# Patient Record
Sex: Female | Born: 1985 | Race: Black or African American | Hispanic: No | Marital: Single | Smoking: Never smoker
Health system: Southern US, Community
[De-identification: ages and names within clinical notes are randomized; demographics above are authoritative.]

## PROBLEM LIST (undated history)

## (undated) DIAGNOSIS — F909 Attention-deficit hyperactivity disorder, unspecified type: Secondary | ICD-10-CM

## (undated) HISTORY — PX: BREAST SURGERY: SHX581

---
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
## 2006-11-01 ENCOUNTER — Emergency Department (HOSPITAL_COMMUNITY): Admission: EM | Admit: 2006-11-01 | Discharge: 2006-11-01 | Payer: Self-pay | Admitting: Emergency Medicine

## 2007-07-14 ENCOUNTER — Inpatient Hospital Stay (HOSPITAL_COMMUNITY): Admission: AD | Admit: 2007-07-14 | Discharge: 2007-07-14 | Payer: Self-pay | Admitting: Family Medicine

## 2008-05-01 ENCOUNTER — Emergency Department (HOSPITAL_COMMUNITY): Admission: EM | Admit: 2008-05-01 | Discharge: 2008-05-02 | Payer: Self-pay | Admitting: Emergency Medicine

## 2008-05-14 ENCOUNTER — Encounter: Admission: RE | Admit: 2008-05-14 | Discharge: 2008-05-14 | Payer: Self-pay | Admitting: Internal Medicine

## 2008-09-26 ENCOUNTER — Encounter: Admission: RE | Admit: 2008-09-26 | Discharge: 2008-10-22 | Payer: Self-pay | Admitting: Occupational Medicine

## 2009-09-10 ENCOUNTER — Emergency Department (HOSPITAL_COMMUNITY): Admission: EM | Admit: 2009-09-10 | Discharge: 2009-09-10 | Payer: Self-pay | Admitting: Emergency Medicine

## 2009-09-15 ENCOUNTER — Emergency Department (HOSPITAL_COMMUNITY): Admission: EM | Admit: 2009-09-15 | Discharge: 2009-09-16 | Payer: Self-pay | Admitting: Emergency Medicine

## 2010-05-07 LAB — URINALYSIS, ROUTINE W REFLEX MICROSCOPIC
Bilirubin Urine: NEGATIVE
Glucose, UA: NEGATIVE mg/dL
Protein, ur: NEGATIVE mg/dL
Specific Gravity, Urine: 1.029 (ref 1.005–1.030)

## 2010-05-07 LAB — URINE MICROSCOPIC-ADD ON

## 2010-05-14 ENCOUNTER — Emergency Department (HOSPITAL_COMMUNITY)
Admission: EM | Admit: 2010-05-14 | Discharge: 2010-05-14 | Disposition: A | Discharge status: Home / Self Care | Payer: Self-pay | Attending: Emergency Medicine | Admitting: Emergency Medicine

## 2010-05-14 DIAGNOSIS — M545 Low back pain, unspecified: Secondary | ICD-10-CM | POA: Insufficient documentation

## 2010-05-14 DIAGNOSIS — T1490XA Injury, unspecified, initial encounter: Secondary | ICD-10-CM | POA: Insufficient documentation

## 2010-05-14 LAB — POCT PREGNANCY, URINE: Preg Test, Ur: NEGATIVE

## 2010-10-23 LAB — URINALYSIS, ROUTINE W REFLEX MICROSCOPIC
Glucose, UA: NEGATIVE
Protein, ur: NEGATIVE
pH: 5.5

## 2010-10-23 LAB — WET PREP, GENITAL

## 2010-10-23 LAB — URINE MICROSCOPIC-ADD ON

## 2010-10-23 LAB — POCT PREGNANCY, URINE: Preg Test, Ur: NEGATIVE

## 2010-11-06 LAB — URINE MICROSCOPIC-ADD ON

## 2010-11-06 LAB — URINALYSIS, ROUTINE W REFLEX MICROSCOPIC
Bilirubin Urine: NEGATIVE
Glucose, UA: NEGATIVE
Ketones, ur: NEGATIVE
Urobilinogen, UA: 1

## 2010-11-06 LAB — URINE CULTURE

## 2010-11-06 LAB — PREGNANCY, URINE: Preg Test, Ur: NEGATIVE

## 2011-04-17 ENCOUNTER — Encounter (HOSPITAL_COMMUNITY): Payer: Self-pay | Admitting: Emergency Medicine

## 2011-04-17 ENCOUNTER — Emergency Department (HOSPITAL_COMMUNITY)
Admission: EM | Admit: 2011-04-17 | Discharge: 2011-04-18 | Disposition: A | Discharge status: Home / Self Care | Payer: Self-pay | Attending: Emergency Medicine | Admitting: Emergency Medicine

## 2011-04-17 DIAGNOSIS — T07XXXA Unspecified multiple injuries, initial encounter: Secondary | ICD-10-CM

## 2011-04-17 DIAGNOSIS — IMO0002 Reserved for concepts with insufficient information to code with codable children: Secondary | ICD-10-CM | POA: Insufficient documentation

## 2011-04-17 DIAGNOSIS — M542 Cervicalgia: Secondary | ICD-10-CM | POA: Insufficient documentation

## 2011-04-17 DIAGNOSIS — M545 Low back pain, unspecified: Secondary | ICD-10-CM | POA: Insufficient documentation

## 2011-04-17 DIAGNOSIS — M25519 Pain in unspecified shoulder: Secondary | ICD-10-CM | POA: Insufficient documentation

## 2011-04-17 DIAGNOSIS — Y92410 Unspecified street and highway as the place of occurrence of the external cause: Secondary | ICD-10-CM | POA: Insufficient documentation
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
## 2011-04-18 ENCOUNTER — Emergency Department (HOSPITAL_COMMUNITY): Payer: Self-pay

## 2011-04-18 MED ORDER — CYCLOBENZAPRINE HCL 10 MG PO TABS: 10.0000 mg | ORAL_TABLET | Freq: Three times a day (TID) | ORAL | Status: AC | PRN

## 2011-04-18 MED ORDER — IBUPROFEN 600 MG PO TABS: 600.0000 mg | ORAL_TABLET | Freq: Four times a day (QID) | ORAL | Status: AC | PRN

## 2011-04-18 MED ORDER — KETOROLAC TROMETHAMINE 30 MG/ML IJ SOLN
30.0000 mg | Freq: Once | INTRAMUSCULAR | Status: AC
  Administered 2011-04-18: 30 mg via INTRAMUSCULAR
  Filled 2011-04-18: qty 1

## 2011-04-18 NOTE — Discharge Instructions (Signed)
The x-ray of your lower back tonight was normal of a muscle strain take the medication prescribed as directed.  He also can use moist heat to lower back pain or neck.  Several times a day
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
## 2011-04-18 NOTE — ED Provider Notes (Signed)
Medical screening examination/treatment/procedure(s) were performed by non-physician practitioner and as supervising physician I was immediately available for consultation/collaboration.   Hanley Seamen, MD 04/18/11 [phone]

## 2012-03-14 ENCOUNTER — Ambulatory Visit: Payer: Self-pay | Admitting: Family Medicine

## 2012-03-14 VITALS — BP 124/84 | HR 94 | Temp 100.8°F | Resp 16 | Ht 69.5 in | Wt 273.0 lb

## 2012-03-14 DIAGNOSIS — J020 Streptococcal pharyngitis: Secondary | ICD-10-CM

## 2012-03-14 DIAGNOSIS — R509 Fever, unspecified: Secondary | ICD-10-CM

## 2012-03-14 LAB — POCT URINALYSIS DIPSTICK
Glucose, UA: NEGATIVE
Nitrite, UA: NEGATIVE
Protein, UA: 30
Spec Grav, UA: >=1.03

## 2012-03-14 LAB — POCT RAPID STREP A (OFFICE): Rapid Strep A Screen: POSITIVE — AB

## 2012-03-14 MED ORDER — AMOXICILLIN 875 MG PO TABS: 875.0000 mg | ORAL_TABLET | Freq: Two times a day (BID) | ORAL | Status: DC

## 2012-03-14 MED ORDER — NON FORMULARY: 15.0000 mL | Freq: Once | Status: DC

## 2012-03-14 MED ORDER — IBUPROFEN 200 MG PO TABS: 800.0000 mg | ORAL_TABLET | Freq: Once | ORAL | Status: DC

## 2012-03-14 MED ORDER — HYDROCODONE-ACETAMINOPHEN 7.5-325 MG/15ML PO SOLN: 10.0000 mL | Freq: Four times a day (QID) | ORAL | Status: DC | PRN

## 2012-03-14 NOTE — Progress Notes (Signed)
Subjective:    Patient ID: Tanya Morse, female    DOB: 08-12-1985, 27 y.o.   MRN: [id number] Chief Complaint  Patient presents with  . Sore Throat    6 DAYS  . Headache  . Dizziness  . Generalized Body Aches  . Fever    HPI  Started 1 wk ago - congested, very sleepy.  To weak to walk at home - was crawling to get to the bathroom so mom finally brought her in.  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 Has been taking Tylenol cold and sinus to help with sxs but has not taking ANY medication - no tylenol or ibuprofen/aleve, etc today.  Today was the first day that she did not feel well enough to go to class.   She is sleeping a ton and feeling very swimmy headed. Not eating at all and not drinking much either as throat hurts so bad.  Hurts to move neck because her throat hurts - no muscle or spine pain.  HA is bad, she is not urinating much at all and it is very dark.  Hurts all over.  History reviewed. No pertinent past medical history. No current outpatient prescriptions on file prior to visit.   No current facility-administered medications on file prior to visit.   No Known Allergies  Review of Systems  Constitutional: Positive for fever, chills, diaphoresis, activity change, appetite change and fatigue. Negative for unexpected weight change.  HENT: Positive for congestion, sore throat, trouble swallowing, neck pain and voice change. Negative for ear pain, nosebleeds, rhinorrhea, sneezing, drooling, mouth sores, neck stiffness, postnasal drip, sinus pressure and ear discharge.   Eyes: Positive for photophobia. Negative for pain.  Respiratory: Positive for cough. Negative for shortness of breath.   Cardiovascular: Negative for chest pain, palpitations and leg swelling.  Gastrointestinal: Negative for nausea, vomiting, abdominal pain and constipation.  Endocrine: Positive for cold intolerance. Negative for polydipsia and polyuria.  Genitourinary: Positive for  decreased urine volume. Negative for dysuria and frequency.  Musculoskeletal: Positive for myalgias, back pain and arthralgias. Negative for joint swelling and gait problem.  Skin: Negative for rash.  Neurological: Positive for dizziness, weakness, light-headedness and headaches. Negative for syncope, facial asymmetry and numbness.  Hematological: Positive for adenopathy. Does not bruise/bleed easily.  Psychiatric/Behavioral: Negative for sleep disturbance.      BP 124/84  Pulse 94  Temp(Src) 100.8 F (38.2 C) (Oral)  Resp 16  Ht 5' 9.5" (1.765 m)  Wt 273 lb (123.832 kg)  BMI 39.75 kg/m2  SpO2 98% Objective:   Physical Exam  Constitutional: She is oriented to person, place, and time. She appears well-developed and well-nourished. She appears lethargic. She is sleeping. She has a sickly appearance. She appears ill. No distress.  HENT:  Head: Normocephalic and atraumatic. No trismus in the jaw.  Right Ear: Tympanic membrane, external ear and ear canal normal.  Left Ear: Tympanic membrane, external ear and ear canal normal.  Nose: Mucosal edema and rhinorrhea present.  Mouth/Throat: Uvula is midline. Mucous membranes are not pale and not dry. No edematous. Oropharyngeal exudate, posterior oropharyngeal edema and posterior oropharyngeal erythema present. No tonsillar abscesses.  Both tonsils swollen to 3+ with mild erythema and moderate amount of white purulent exudate on them.  Voice muffled due to pain with talking.  Eyes: Conjunctivae and EOM are normal. Pupils are equal, round, and reactive to light. Right eye exhibits no discharge. Left eye exhibits no discharge.  No scleral icterus.  + photophobia  Neck: Normal range of motion. Neck supple. No rigidity. Normal range of motion present. No Brudzinski's sign and no Kernig's sign noted. No mass and no thyromegaly present.  Neck pain is anterior, along lymphnodes. No tenderness posteriorly along c spine or paraspinal area.   Cardiovascular: Normal rate, regular rhythm, normal heart sounds and intact distal pulses.   Pulmonary/Chest: Effort normal and breath sounds normal.  Abdominal: Soft. Bowel sounds are normal. She exhibits no distension, no pulsatile liver, no pulsatile midline mass and no mass. There is no hepatosplenomegaly. There is generalized tenderness. There is CVA tenderness. There is no rigidity, no rebound, no guarding, no tenderness at McBurney's point and negative Murphy's sign. No hernia.  Abdominal exam difficult and limited due to obesity  Lymphadenopathy:       Head (right side): Submandibular, tonsillar, preauricular and posterior auricular adenopathy present. No submental adenopathy present.       Head (left side): Submandibular, tonsillar, preauricular and posterior auricular adenopathy present. No submental adenopathy present.    She has cervical adenopathy.       Right cervical: Superficial cervical adenopathy present. No deep cervical and no posterior cervical adenopathy present.      Left cervical: Superficial cervical adenopathy present. No deep cervical and no posterior cervical adenopathy present.       Right: No supraclavicular adenopathy present.       Left: No supraclavicular adenopathy present.  Neurological: She is oriented to person, place, and time. She appears lethargic.  Skin: Skin is warm and dry. She is not diaphoretic. No erythema.  Psychiatric: She has a normal mood and affect. Her behavior is normal.          Results for orders placed in visit on 03/14/12  POCT RAPID STREP A (OFFICE)      Result Value Range   Rapid Strep A Screen Positive (*) Negative  POCT URINALYSIS DIPSTICK      Result Value Range   Color, UA amber     Clarity, UA cloudy     Glucose, UA neg     Bilirubin, UA small     Ketones, UA 40     Spec Grav, UA >=1.030     Blood, UA neg     pH, UA 6.0     Protein, UA 30     Urobilinogen, UA 2.0     Nitrite, UA neg     Leukocytes, UA small (1+)       Assessment & Plan:  Fever - Plan: POCT rapid strep A, POCT urinalysis dipstick, ibuprofen (ADVIL,MOTRIN) tablet 800 mg, NON FORMULARY 15 mL, CANCELED: Comprehensive metabolic panel, CANCELED: POCT CBC, CANCELED: POCT UA - Microscopic Only, CANCELED: Epstein-Barr virus VCA, IgM.  Gave ibuprofen 800mg  po x 1 and lortab elixer 15 mL po x 1 in office.  Streptococcal sore throat - Plan: amoxicillin (AMOXIL) 875 MG tablet, ibuprofen (ADVIL,MOTRIN) tablet 800 mg, NON FORMULARY 15 mL  Meds ordered this encounter  Medications  . amoxicillin (AMOXIL) 875 MG tablet    Sig: Take 1 tablet (875 mg total) by mouth 2 (two) times daily.    Dispense:  20 tablet    Refill:  0  .  HYDROcodone-acetaminophen (HYCET) 7.5-325 mg/15 ml solution    Sig: Take 10-15 mLs by mouth every 6 (six) hours as needed for pain.    Dispense:  140 mL    Refill:  0  . ibuprofen (ADVIL,MOTRIN) tablet 800 mg  Sig:   . NON FORMULARY 15 mL    Sig:     Order Specific Question:  Brand Name:    Answer:  loratab elixer    Order Specific Question:  Generic name:    Answer:  hydrocodone    Order Specific Question:  Form:    Answer:  Elixir [19]    Order Specific Question:  Length of Therapy:    Answer:  Other, see comments     Comments:  once    Order Specific Question:  Reason for Non-Formulary    Answer:  medication name not in epic    Order Specific Question:  How soon needed? (normally 72 hrs needed to procure):    Answer:  0-24 hrs   Discussed extensively signs to watch out for - pt appears very ill, she is VERY dehydrated and very fatigued. However, she does not have health insurance and so pt and her mother would like to minimize testing and treatment as long as it is safe so she will push fluids and mother will keep a close eye on her.  If she worsens at all - more voice changes, still with trouble swallowing, continued lethargy, headache, etc - she needs to be seen back here for further evaluation as she could  develop a peritonsillar abscess or have something else complicating illness like mono.  Pt and mother understand and are agreeable to plan.  Pt should be feeling MUCH better within 24 hrs and if not, RTC.

## 2012-03-14 NOTE — Patient Instructions (Signed)

## 2012-03-16 ENCOUNTER — Telehealth: Payer: Self-pay

## 2012-03-16 NOTE — Telephone Encounter (Signed)
PT STATES SHE WAS RETURNING DR SHAW'S CALL. UNDERSTANDS SHE DOESN'T COME IN UNTIL THE WEEKEND PLEASE CALL [phone]

## 2012-03-16 NOTE — Telephone Encounter (Signed)
From Dr Clelia Croft note:Discussed extensively signs to watch out for - pt appears very ill, she is VERY dehydrated and very fatigued. However, she does not have health insurance and so pt and her mother would like to minimize testing and treatment as long as it is safe so she will push fluids and mother will keep a close eye on her. If she worsens at all - more voice changes, still with trouble swallowing, continued lethargy, headache, etc - she needs to be seen back here for further evaluation as she could develop a peritonsillar abscess or have something else complicating illness like mono. Pt and mother understand and are agreeable to plan. Pt should be feeling MUCH better within 24 hrs and if not, RTC.  I left message for her to call me back. So I can check on her progress.

## 2012-03-18 NOTE — Telephone Encounter (Signed)
Patient has not returned my calls. I left 2 messages, FYI to you

## 2013-05-31 ENCOUNTER — Emergency Department (HOSPITAL_COMMUNITY): Payer: No Typology Code available for payment source

## 2013-05-31 ENCOUNTER — Encounter (HOSPITAL_COMMUNITY): Payer: Self-pay | Admitting: Emergency Medicine

## 2013-05-31 ENCOUNTER — Emergency Department (HOSPITAL_COMMUNITY)
Admission: EM | Admit: 2013-05-31 | Discharge: 2013-05-31 | Disposition: A | Discharge status: Home / Self Care | Payer: No Typology Code available for payment source | Attending: Emergency Medicine | Admitting: Emergency Medicine

## 2013-05-31 DIAGNOSIS — Y92410 Unspecified street and highway as the place of occurrence of the external cause: Secondary | ICD-10-CM | POA: Insufficient documentation

## 2013-05-31 DIAGNOSIS — Y9389 Activity, other specified: Secondary | ICD-10-CM | POA: Insufficient documentation

## 2013-05-31 DIAGNOSIS — IMO0002 Reserved for concepts with insufficient information to code with codable children: Secondary | ICD-10-CM | POA: Insufficient documentation

## 2013-05-31 DIAGNOSIS — S0993XA Unspecified injury of face, initial encounter: Secondary | ICD-10-CM | POA: Insufficient documentation

## 2013-05-31 DIAGNOSIS — S199XXA Unspecified injury of neck, initial encounter: Secondary | ICD-10-CM

## 2013-05-31 DIAGNOSIS — M549 Dorsalgia, unspecified: Secondary | ICD-10-CM

## 2013-05-31 MED ORDER — IBUPROFEN 800 MG PO TABS
800.0000 mg | ORAL_TABLET | Freq: Once | ORAL | Status: AC
  Administered 2013-05-31: 800 mg via ORAL
  Filled 2013-05-31: qty 1

## 2013-05-31 MED ORDER — IBUPROFEN 600 MG PO TABS: 600.0000 mg | ORAL_TABLET | Freq: Three times a day (TID) | ORAL | Status: DC

## 2013-05-31 MED ORDER — OXYCODONE-ACETAMINOPHEN 5-325 MG PO TABS
1.0000 | ORAL_TABLET | Freq: Once | ORAL | Status: AC
  Administered 2013-05-31: 1 via ORAL
  Filled 2013-05-31: qty 1

## 2013-05-31 NOTE — ED Notes (Signed)
Assumed care of pt in hallway bed B, pt c/o pain 10/10 "all over"
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
## 2013-06-04 ENCOUNTER — Emergency Department (HOSPITAL_COMMUNITY): Payer: No Typology Code available for payment source

## 2013-06-04 ENCOUNTER — Encounter (HOSPITAL_COMMUNITY): Payer: Self-pay | Admitting: Emergency Medicine

## 2013-06-04 ENCOUNTER — Emergency Department (HOSPITAL_COMMUNITY)
Admission: EM | Admit: 2013-06-04 | Discharge: 2013-06-04 | Disposition: A | Discharge status: Home / Self Care | Payer: No Typology Code available for payment source | Attending: Emergency Medicine | Admitting: Emergency Medicine

## 2013-06-04 DIAGNOSIS — M25519 Pain in unspecified shoulder: Secondary | ICD-10-CM | POA: Insufficient documentation

## 2013-06-04 DIAGNOSIS — N926 Irregular menstruation, unspecified: Secondary | ICD-10-CM | POA: Insufficient documentation

## 2013-06-04 DIAGNOSIS — G8911 Acute pain due to trauma: Secondary | ICD-10-CM | POA: Insufficient documentation

## 2013-06-04 DIAGNOSIS — Z791 Long term (current) use of non-steroidal anti-inflammatories (NSAID): Secondary | ICD-10-CM | POA: Insufficient documentation

## 2013-06-04 DIAGNOSIS — R51 Headache: Secondary | ICD-10-CM | POA: Insufficient documentation

## 2013-06-04 DIAGNOSIS — I498 Other specified cardiac arrhythmias: Secondary | ICD-10-CM | POA: Insufficient documentation

## 2013-06-04 DIAGNOSIS — R209 Unspecified disturbances of skin sensation: Secondary | ICD-10-CM | POA: Insufficient documentation

## 2013-06-04 MED ORDER — HYDROCODONE-ACETAMINOPHEN 5-325 MG PO TABS
2.0000 | ORAL_TABLET | Freq: Once | ORAL | Status: AC
  Administered 2013-06-04: 2 via ORAL
  Filled 2013-06-04: qty 2

## 2013-06-04 MED ORDER — HYDROCODONE-ACETAMINOPHEN 5-325 MG PO TABS: 1.0000 | ORAL_TABLET | Freq: Four times a day (QID) | ORAL | Status: DC | PRN
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
## 2013-11-12 ENCOUNTER — Encounter (HOSPITAL_COMMUNITY): Payer: Self-pay | Admitting: Emergency Medicine

## 2013-11-12 ENCOUNTER — Emergency Department (HOSPITAL_COMMUNITY)
Admission: EM | Admit: 2013-11-12 | Discharge: 2013-11-12 | Disposition: A | Discharge status: Home / Self Care | Payer: No Typology Code available for payment source | Attending: Emergency Medicine | Admitting: Emergency Medicine

## 2013-11-12 DIAGNOSIS — G8911 Acute pain due to trauma: Secondary | ICD-10-CM | POA: Insufficient documentation

## 2013-11-12 DIAGNOSIS — M545 Low back pain, unspecified: Secondary | ICD-10-CM

## 2013-11-12 DIAGNOSIS — Z79899 Other long term (current) drug therapy: Secondary | ICD-10-CM | POA: Diagnosis not present

## 2013-11-12 MED ORDER — TRAMADOL HCL 50 MG PO TABS: 50.0000 mg | ORAL_TABLET | Freq: Four times a day (QID) | ORAL | Status: DC | PRN
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
## 2013-11-14 NOTE — ED Provider Notes (Signed)
Medical screening examination/treatment/procedure(s) were performed by non-physician practitioner and as supervising physician I was immediately available for consultation/collaboration.     Sadee Osland E Trust Crago, MD 11/14/13 1321 

## 2014-04-06 ENCOUNTER — Emergency Department (HOSPITAL_COMMUNITY): Payer: 59

## 2014-04-06 ENCOUNTER — Encounter (HOSPITAL_COMMUNITY): Payer: Self-pay | Admitting: Emergency Medicine

## 2014-04-06 ENCOUNTER — Emergency Department (HOSPITAL_COMMUNITY)
Admission: EM | Admit: 2014-04-06 | Discharge: 2014-04-06 | Disposition: A | Discharge status: Home / Self Care | Payer: 59 | Attending: Emergency Medicine | Admitting: Emergency Medicine

## 2014-04-06 DIAGNOSIS — S4991XA Unspecified injury of right shoulder and upper arm, initial encounter: Secondary | ICD-10-CM | POA: Insufficient documentation

## 2014-04-06 DIAGNOSIS — Y92410 Unspecified street and highway as the place of occurrence of the external cause: Secondary | ICD-10-CM | POA: Diagnosis not present

## 2014-04-06 DIAGNOSIS — Y9389 Activity, other specified: Secondary | ICD-10-CM | POA: Diagnosis not present

## 2014-04-06 DIAGNOSIS — Z8659 Personal history of other mental and behavioral disorders: Secondary | ICD-10-CM | POA: Diagnosis not present

## 2014-04-06 DIAGNOSIS — S8992XA Unspecified injury of left lower leg, initial encounter: Secondary | ICD-10-CM | POA: Insufficient documentation

## 2014-04-06 DIAGNOSIS — Z79899 Other long term (current) drug therapy: Secondary | ICD-10-CM | POA: Insufficient documentation

## 2014-04-06 DIAGNOSIS — M25521 Pain in right elbow: Secondary | ICD-10-CM

## 2014-04-06 DIAGNOSIS — Y998 Other external cause status: Secondary | ICD-10-CM | POA: Insufficient documentation

## 2014-04-06 DIAGNOSIS — M25562 Pain in left knee: Secondary | ICD-10-CM

## 2014-04-06 DIAGNOSIS — S59901A Unspecified injury of right elbow, initial encounter: Secondary | ICD-10-CM | POA: Diagnosis not present

## 2014-04-06 DIAGNOSIS — M25511 Pain in right shoulder: Secondary | ICD-10-CM

## 2014-04-06 HISTORY — DX: Attention-deficit hyperactivity disorder, unspecified type: F90.9

## 2014-04-06 MED ORDER — NAPROXEN 500 MG PO TABS: 500.0000 mg | ORAL_TABLET | Freq: Two times a day (BID) | ORAL | Status: DC

## 2014-04-06 MED ORDER — IBUPROFEN 800 MG PO TABS
800.0000 mg | ORAL_TABLET | Freq: Once | ORAL | Status: AC
  Administered 2014-04-06: 800 mg via ORAL
  Filled 2014-04-06: qty 1
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
## 2015-12-05 DIAGNOSIS — Z23 Encounter for immunization: Secondary | ICD-10-CM | POA: Diagnosis not present
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
## 2016-05-22 ENCOUNTER — Ambulatory Visit (HOSPITAL_COMMUNITY)
Admission: EM | Admit: 2016-05-22 | Discharge: 2016-05-22 | Disposition: A | Discharge status: Nursing Home (Includes ICF) | Payer: Self-pay

## 2016-05-22 ENCOUNTER — Encounter (HOSPITAL_COMMUNITY): Payer: Self-pay

## 2016-05-22 ENCOUNTER — Emergency Department (HOSPITAL_COMMUNITY)
Admission: EM | Admit: 2016-05-22 | Discharge: 2016-05-22 | Disposition: A | Discharge status: Home / Self Care | Payer: No Typology Code available for payment source | Attending: Emergency Medicine | Admitting: Emergency Medicine

## 2016-05-22 DIAGNOSIS — S161XXA Strain of muscle, fascia and tendon at neck level, initial encounter: Secondary | ICD-10-CM | POA: Diagnosis not present

## 2016-05-22 DIAGNOSIS — Z79899 Other long term (current) drug therapy: Secondary | ICD-10-CM | POA: Diagnosis not present

## 2016-05-22 DIAGNOSIS — Y92410 Unspecified street and highway as the place of occurrence of the external cause: Secondary | ICD-10-CM | POA: Insufficient documentation

## 2016-05-22 DIAGNOSIS — M546 Pain in thoracic spine: Secondary | ICD-10-CM | POA: Diagnosis not present

## 2016-05-22 DIAGNOSIS — F909 Attention-deficit hyperactivity disorder, unspecified type: Secondary | ICD-10-CM | POA: Insufficient documentation

## 2016-05-22 DIAGNOSIS — Y939 Activity, unspecified: Secondary | ICD-10-CM | POA: Insufficient documentation

## 2016-05-22 DIAGNOSIS — M542 Cervicalgia: Secondary | ICD-10-CM

## 2016-05-22 DIAGNOSIS — R51 Headache: Secondary | ICD-10-CM

## 2016-05-22 DIAGNOSIS — Y999 Unspecified external cause status: Secondary | ICD-10-CM | POA: Diagnosis not present

## 2016-05-22 DIAGNOSIS — T148XXA Other injury of unspecified body region, initial encounter: Secondary | ICD-10-CM

## 2016-05-22 DIAGNOSIS — M791 Myalgia: Secondary | ICD-10-CM

## 2016-05-22 DIAGNOSIS — S199XXA Unspecified injury of neck, initial encounter: Secondary | ICD-10-CM | POA: Diagnosis present

## 2016-05-22 LAB — POC URINE PREG, ED: Preg Test, Ur: NEGATIVE

## 2016-05-22 MED ORDER — FLUORESCEIN SODIUM 0.6 MG OP STRP
1.0000 | ORAL_STRIP | Freq: Once | OPHTHALMIC | Status: AC
  Administered 2016-05-22: 1 via OPHTHALMIC
  Filled 2016-05-22: qty 1

## 2016-05-22 MED ORDER — KETOROLAC TROMETHAMINE 30 MG/ML IJ SOLN
30.0000 mg | Freq: Once | INTRAMUSCULAR | Status: AC
  Administered 2016-05-22: 30 mg via INTRAMUSCULAR
  Filled 2016-05-22: qty 1

## 2016-05-22 MED ORDER — TETRACAINE HCL 0.5 % OP SOLN
1.0000 [drp] | Freq: Once | OPHTHALMIC | Status: AC
  Administered 2016-05-22: 1 [drp] via OPHTHALMIC
  Filled 2016-05-22: qty 4

## 2016-05-22 MED ORDER — IBUPROFEN 800 MG PO TABS: 800.0000 mg | ORAL_TABLET | Freq: Three times a day (TID) | ORAL | 0 refills | Status: DC

## 2016-05-22 MED ORDER — CYCLOBENZAPRINE HCL 10 MG PO TABS: 10.0000 mg | ORAL_TABLET | Freq: Two times a day (BID) | ORAL | 0 refills | Status: DC | PRN
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
## 2016-05-22 NOTE — Discharge Instructions (Addendum)
Take ibuprofen as directed for pain.  Take flexeril as directed for pain. Do not take it while driving as it will make you drowsy.   Follow-up with your primary care doctor in 24-48 hours.   Return to the emergency department for any worsenign headache, fever, numbness/weakness of arms or legs, difficulty walking or any other worsening or concerning symptoms.   If you do not have a primary care doctor you see regularly, please you the list below. Please call them to arrange for follow-up.    No Primary Care Doctor Call [facility]  [phone] Other agencies that provide inexpensive medical care    [facility]  [phone]    [facility]  [phone]    [facility]  [phone]    [facility]  [phone]    [facility]  [phone]    [facility]  [phone]

## 2016-05-22 NOTE — ED Notes (Signed)
PT DISCHARGED. INSTRUCTIONS AND PRESCRIPTIONS GIVEN. AAOX4. PT IN NO APPARENT DISTRESS. THE OPPORTUNITY TO ASK QUESTIONS WAS PROVIDED. 
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
## 2016-06-16 DIAGNOSIS — H5203 Hypermetropia, bilateral: Secondary | ICD-10-CM | POA: Diagnosis not present

## 2016-12-21 DIAGNOSIS — Z113 Encounter for screening for infections with a predominantly sexual mode of transmission: Secondary | ICD-10-CM | POA: Diagnosis not present

## 2016-12-21 DIAGNOSIS — F909 Attention-deficit hyperactivity disorder, unspecified type: Secondary | ICD-10-CM | POA: Diagnosis not present

## 2019-01-24 ENCOUNTER — Ambulatory Visit: Payer: Self-pay | Attending: Internal Medicine

## 2019-01-24 DIAGNOSIS — Z20822 Contact with and (suspected) exposure to covid-19: Secondary | ICD-10-CM

## 2019-01-24 DIAGNOSIS — Z20828 Contact with and (suspected) exposure to other viral communicable diseases: Secondary | ICD-10-CM | POA: Insufficient documentation

## 2019-01-25 ENCOUNTER — Ambulatory Visit: Payer: Self-pay | Attending: Internal Medicine

## 2019-01-25 DIAGNOSIS — Z20822 Contact with and (suspected) exposure to covid-19: Secondary | ICD-10-CM

## 2019-01-25 DIAGNOSIS — Z20828 Contact with and (suspected) exposure to other viral communicable diseases: Secondary | ICD-10-CM | POA: Insufficient documentation

## 2019-01-25 LAB — NOVEL CORONAVIRUS, NAA: SARS-CoV-2, NAA: NOT DETECTED

## 2019-01-26 ENCOUNTER — Telehealth: Payer: Self-pay | Admitting: Family

## 2019-01-26 ENCOUNTER — Ambulatory Visit: Payer: Self-pay | Attending: Internal Medicine

## 2019-01-26 LAB — NOVEL CORONAVIRUS, NAA: SARS-CoV-2, NAA: NOT DETECTED

## 2019-01-26 NOTE — Telephone Encounter (Signed)
Patient called back to receive COVID test results from 01/24/2019.Patient was advised that results from 01/25/19 where not back as of yet.

## 2019-02-02 ENCOUNTER — Ambulatory Visit: Payer: Self-pay | Attending: Internal Medicine

## 2019-02-02 DIAGNOSIS — Z20822 Contact with and (suspected) exposure to covid-19: Secondary | ICD-10-CM | POA: Insufficient documentation

## 2019-02-04 LAB — NOVEL CORONAVIRUS, NAA: SARS-CoV-2, NAA: NOT DETECTED

## 2019-05-25 ENCOUNTER — Encounter (HOSPITAL_COMMUNITY): Payer: Self-pay

## 2019-05-25 ENCOUNTER — Other Ambulatory Visit: Payer: Self-pay

## 2019-05-25 ENCOUNTER — Ambulatory Visit (HOSPITAL_COMMUNITY)
Admission: EM | Admit: 2019-05-25 | Discharge: 2019-05-25 | Disposition: A | Discharge status: Home / Self Care | Payer: Self-pay | Attending: Family Medicine | Admitting: Family Medicine

## 2019-05-25 DIAGNOSIS — M545 Low back pain, unspecified: Secondary | ICD-10-CM

## 2019-05-25 DIAGNOSIS — M25552 Pain in left hip: Secondary | ICD-10-CM

## 2019-05-25 MED ORDER — CYCLOBENZAPRINE HCL 10 MG PO TABS: ORAL_TABLET | ORAL | 0 refills | Status: DC

## 2019-05-25 MED ORDER — DICLOFENAC SODIUM 75 MG PO TBEC: 75.0000 mg | DELAYED_RELEASE_TABLET | Freq: Two times a day (BID) | ORAL | 0 refills | Status: DC

## 2019-05-25 NOTE — ED Provider Notes (Signed)
Tanya Morse   [id number] 05/25/19 Arrival Time: 2542  ASSESSMENT & PLAN:  1. Acute left-sided low back pain without sciatica   2. Left hip pain      Able to ambulate here and hemodynamically stable. No indication for imaging for plain imaging at this time.  Begin: Meds ordered this encounter  Medications  . diclofenac (VOLTAREN) 75 MG EC tablet    Sig: Take 1 tablet (75 mg total) by mouth 2 (two) times daily.    Dispense:  14 tablet    Refill:  0  . cyclobenzaprine (FLEXERIL) 10 MG tablet    Sig: Take 1 tablet by mouth 3 times daily as needed for muscle spasm. Warning: May cause drowsiness.    Dispense:  21 tablet    Refill:  0    Medication sedation precautions given. Encourage ROM/movement as tolerated.  Recommend: Follow-up Information    [place].   Why: If worsening or failing to improve as anticipated. Contact information: [address] [place] [phone]          Reviewed expectations re: course of current medical issues. Questions answered. Outlined signs and symptoms indicating need for more acute intervention. Patient verbalized understanding. After Visit Summary given.   SUBJECTIVE: History from: patient.  Tanya Morse is a 34 y.o. female who presents with complaint of fairly persistent left sided lower back and hip pain. Onset abrupt. First noted 5 d ago. Injury/trama: reports falling onto L side; pain noted afterward. History of back problems requiring medical care: no. Pain described as aching and without radiation. Aggravating factors: certain movements and prolonged walking/standing. Alleviating factors: rest. Progressive LE weakness or saddle anesthesia: none. Extremity sensation changes or weakness: none. Ambulatory without difficulty. Normal bowel/bladder habits: yes; without urinary retention. Normal PO intake without n/v. No associated abdominal pain/n/v. Self  treatment: has ibuprofen prn; mild help.    OBJECTIVE:  Vitals:   05/25/19 1522  BP: 106/80  Pulse: 78  Resp: 20  Temp: 98.5 F (36.9 C)  TempSrc: Oral  SpO2: 99%    General appearance: alert; no distress HEENT: [place]; AT Neck: supple with FROM; without midline tenderness CV: RRR Lungs: unlabored respirations; symmetrical air entry Abdomen: soft, non-tender; non-distended Back: moderate and poorly localized tenderness to palpation over left lumbar paraspinal musculature extending over buttock to left hip; FROM at waist; bruising: none; without midline tenderness Extremities: without edema; symmetrical without gross deformities; normal ROM of bilateral LE Skin: warm and dry Neurologic: normal gait; normal sensation and strength of LLE Psychological: alert and cooperative; normal mood and affect    No Known Allergies  Past Medical History:  Diagnosis Date  . ADHD (attention deficit hyperactivity disorder)    Social History   Socioeconomic History  . Marital status: Single    Spouse name: Not on file  . Number of children: Not on file  . Years of education: Not on file  . Highest education level: Not on file  Occupational History  . Not on file  Tobacco Use  . Smoking status: Never Smoker  . Smokeless tobacco: Never Used  Substance and Sexual Activity  . Alcohol use: Yes    Comment: occasional   . Drug use: No  . Sexual activity: Never  Other Topics Concern  . Not on file  Social History Narrative  . Not on file   Social Determinants of Health   Financial Resource Strain:   . Difficulty of Paying  Living Expenses:   Food Insecurity:   . Worried About Programme researcher, broadcasting/film/video in the Last Year:   . Barista in the Last Year:   Transportation Needs:   . Freight forwarder (Medical):   Marland Kitchen Lack of Transportation (Non-Medical):   Physical Activity:   . Days of Exercise per Week:   . Minutes of Exercise per Session:   Stress:   . Feeling of Stress :    Social Connections:   . Frequency of Communication with Friends and Family:   . Frequency of Social Gatherings with Friends and Family:   . Attends Religious Services:   . Active Member of Clubs or Organizations:   . Attends Banker Meetings:   Marland Kitchen Marital Status:   Intimate Partner Violence:   . Fear of Current or Ex-Partner:   . Emotionally Abused:   Marland Kitchen Physically Abused:   . Sexually Abused:    History reviewed. No pertinent family history. Past Surgical History:  Procedure Laterality Date  . BREAST SURGERY       Mardella Layman, MD 05/25/19 [phone]
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
## 2020-03-24 ENCOUNTER — Emergency Department (HOSPITAL_COMMUNITY): Payer: Self-pay

## 2020-03-24 ENCOUNTER — Emergency Department (HOSPITAL_COMMUNITY)
Admission: EM | Admit: 2020-03-24 | Discharge: 2020-03-24 | Disposition: A | Discharge status: Home / Self Care | Payer: Self-pay | Attending: Emergency Medicine | Admitting: Emergency Medicine

## 2020-03-24 ENCOUNTER — Encounter (HOSPITAL_COMMUNITY): Payer: Self-pay | Admitting: Obstetrics and Gynecology

## 2020-03-24 ENCOUNTER — Other Ambulatory Visit: Payer: Self-pay

## 2020-03-24 DIAGNOSIS — R1011 Right upper quadrant pain: Secondary | ICD-10-CM

## 2020-03-24 DIAGNOSIS — N3000 Acute cystitis without hematuria: Secondary | ICD-10-CM | POA: Insufficient documentation

## 2020-03-24 LAB — COMPREHENSIVE METABOLIC PANEL
ALT: 14 U/L (ref 0–44)
AST: 22 U/L (ref 15–41)
Albumin: 4.1 g/dL (ref 3.5–5.0)
Alkaline Phosphatase: 42 U/L (ref 38–126)
Anion gap: 10 (ref 5–15)
BUN: 8 mg/dL (ref 6–20)
CO2: 24 mmol/L (ref 22–32)
Calcium: 8.8 mg/dL — ABNORMAL LOW (ref 8.9–10.3)
Chloride: 106 mmol/L (ref 98–111)
Creatinine, Ser: 0.73 mg/dL (ref 0.44–1.00)
GFR, Estimated: >60 mL/min (ref >60)
Glucose, Bld: 93 mg/dL (ref 70–99)
Potassium: 3.1 mmol/L — ABNORMAL LOW (ref 3.5–5.1)
Sodium: 140 mmol/L (ref 135–145)
Total Bilirubin: 0.9 mg/dL (ref 0.3–1.2)
Total Protein: 7.7 g/dL (ref 6.5–8.1)

## 2020-03-24 LAB — CBC
HCT: 37.6 % (ref 36.0–46.0)
Hemoglobin: 12.4 g/dL (ref 12.0–15.0)
MCH: 31.2 pg (ref 26.0–34.0)
MCHC: 33 g/dL (ref 30.0–36.0)
MCV: 94.7 fL (ref 80.0–100.0)
Platelets: 212 10*3/uL (ref 150–400)
RBC: 3.97 MIL/uL (ref 3.87–5.11)
RDW: 13.3 % (ref 11.5–15.5)
WBC: 6.1 10*3/uL (ref 4.0–10.5)
nRBC: 0 % (ref 0.0–0.2)

## 2020-03-24 LAB — LIPASE, BLOOD: Lipase: 24 U/L (ref 11–51)

## 2020-03-24 LAB — URINALYSIS, ROUTINE W REFLEX MICROSCOPIC
Bilirubin Urine: NEGATIVE
Glucose, UA: NEGATIVE mg/dL
Hgb urine dipstick: NEGATIVE
Ketones, ur: 80 mg/dL — AB
Nitrite: POSITIVE — AB
Protein, ur: NEGATIVE mg/dL
Specific Gravity, Urine: 1.013 (ref 1.005–1.030)
pH: 6 (ref 5.0–8.0)

## 2020-03-24 LAB — I-STAT BETA HCG BLOOD, ED (MC, WL, AP ONLY): I-stat hCG, quantitative: <5 m[IU]/mL (ref <5)

## 2020-03-24 MED ORDER — MORPHINE SULFATE (PF) 4 MG/ML IV SOLN
4.0000 mg | Freq: Once | INTRAVENOUS | Status: AC
  Administered 2020-03-24: 4 mg via INTRAVENOUS
  Filled 2020-03-24: qty 1

## 2020-03-24 MED ORDER — POTASSIUM CHLORIDE ER 10 MEQ PO TBCR: 20.0000 meq | EXTENDED_RELEASE_TABLET | Freq: Every day | ORAL | 0 refills | Status: DC

## 2020-03-24 MED ORDER — CEPHALEXIN 500 MG PO CAPS: 500.0000 mg | ORAL_CAPSULE | Freq: Two times a day (BID) | ORAL | 0 refills | Status: AC

## 2020-03-24 NOTE — Discharge Instructions (Signed)
You are seen today for abdominal pain, your work-up today was assuring.  Your urine does show urinary tract infection and your potassium was slightly low.  I did prescribe antibiotics for your urinary tract infection and I also prescribed you some potassium you can take at home.  Please follow-up with your primary care for the next couple of days.  Please speak to your pharmacist about any new medications prescribed today in regards side effects or other interactions with them.  Get help right away if: Your pain does not go away as soon as your health care provider told you to expect. You cannot stop vomiting. Your pain is only in areas of the abdomen, such as the right side or the left lower portion of the abdomen. Pain on the right side could be caused by appendicitis. You have bloody or black stools, or stools that look like tar. You have severe pain, cramping, or bloating in your abdomen. You have signs of dehydration, such as: Dark urine, very little urine, or no urine. Cracked lips. Dry mouth. Sunken eyes. Sleepiness. Weakness. You have trouble breathing or chest pain.

## 2020-03-24 NOTE — ED Provider Notes (Signed)
[facility]-EMERGENCY DEPT Provider Note   CSN: [id number] Arrival date & time: 03/24/20  1030     History No chief complaint on file.   Tanya Morse is a 35 y.o. female with prior medical history of ADHD that presents the emerge department today for abdominal pain.  Patient states that around 9 in the morning she started having right upper quadrant abdominal pain.  Is constant, not intermittent.  Does not radiate anywhere.  No shoulder pain or back pain.  States that the pain is a stabbing sensation, right now is a 7 out of 10.  Denies any vaginal complaints or urinary complaints.  States that she never had pain like this before.  Denies any abdominal surgeries.  No abdominal disease that he is aware of.  No vomiting or diarrhea.  No fevers.  No cough or URI symptoms.  No other complaints.  No biliary disease that she is aware of.  Pain did not start after eating, states that she did not eat this morning because she did not have any appetite.  Was feeling fine yesterday.  No other complaints.  Works as a Lawyer, often lifts heavy patients.  HPI     Past Medical History:  Diagnosis Date  . ADHD (attention deficit hyperactivity disorder)     There are no problems to display for this patient.   Past Surgical History:  Procedure Laterality Date  . BREAST SURGERY       OB History   No obstetric history on file.     No family history on file.  Social History   Tobacco Use  . Smoking status: Never Smoker  . Smokeless tobacco: Never Used  Vaping Use  . Vaping Use: Never used  Substance Use Topics  . Alcohol use: Yes    Comment: occasional   . Drug use: No    Home Medications Prior to Admission medications   Medication Sig Start Date End Date Taking? Authorizing Provider  cephALEXin (KEFLEX) 500 MG capsule Take 1 capsule (500 mg total) by mouth 2 (two) times daily for 5 days. 03/24/20 03/29/20 Yes , , PA-C  potassium chloride (KLOR-CON) 10 MEQ  tablet Take 2 tablets (20 mEq total) by mouth daily for 10 days. 03/24/20 04/03/20 Yes , , PA-C  cyclobenzaprine (FLEXERIL) 10 MG tablet Take 1 tablet by mouth 3 times daily as needed for muscle spasm. Warning: May cause drowsiness. Patient not taking: No sig reported 05/25/19   Mardella Layman, MD  diclofenac (VOLTAREN) 75 MG EC tablet Take 1 tablet (75 mg total) by mouth 2 (two) times daily. Patient not taking: No sig reported 05/25/19   Mardella Layman, MD    Allergies    Patient has no known allergies.  Review of Systems   Review of Systems  Constitutional: Negative for chills, diaphoresis, fatigue and fever.  HENT: Negative for congestion, sore throat and trouble swallowing.   Eyes: Negative for pain and visual disturbance.  Respiratory: Negative for cough, shortness of breath and wheezing.   Cardiovascular: Negative for chest pain, palpitations and leg swelling.  Gastrointestinal: Positive for abdominal pain. Negative for abdominal distention, diarrhea, nausea and vomiting.  Genitourinary: Negative for difficulty urinating.  Musculoskeletal: Negative for back pain, neck pain and neck stiffness.  Skin: Negative for pallor.  Neurological: Negative for dizziness, speech difficulty, weakness and headaches.  Psychiatric/Behavioral: Negative for confusion.    Physical Exam Updated Vital Signs BP 130/83   Pulse (!) 48   Temp 98.7 F (37.1  C) (Oral)   Resp 16   Ht 5\' 9"  (1.753 m)   Wt 103 kg   LMP 03/10/2020 (Approximate)   SpO2 100%   BMI 33.53 kg/m   Physical Exam Constitutional:      General: She is not in acute distress.    Appearance: Normal appearance. She is not ill-appearing, toxic-appearing or diaphoretic.  HENT:     Mouth/Throat:     Mouth: Mucous membranes are moist.     Pharynx: Oropharynx is clear.  Eyes:     General: No scleral icterus.    Extraocular Movements: Extraocular movements intact.     Pupils: Pupils are equal, round, and reactive to light.   Cardiovascular:     Rate and Rhythm: Normal rate and regular rhythm.     Pulses: Normal pulses.     Heart sounds: Normal heart sounds.  Pulmonary:     Effort: Pulmonary effort is normal. No respiratory distress.     Breath sounds: Normal breath sounds. No stridor. No wheezing, rhonchi or rales.  Chest:     Chest wall: No tenderness.  Abdominal:     General: Abdomen is flat. There is no distension.     Palpations: Abdomen is soft.     Tenderness: There is abdominal tenderness in the right upper quadrant. There is no guarding or rebound. Negative signs include McBurney's sign and obturator sign.       Comments: Right upper quadrant tenderness, no guarding.  No true Murphy sign.  Musculoskeletal:        General: No swelling or tenderness. Normal range of motion.     Cervical back: Normal range of motion and neck supple. No rigidity.     Right lower leg: No edema.     Left lower leg: No edema.  Skin:    General: Skin is warm and dry.     Capillary Refill: Capillary refill takes less than 2 seconds.     Coloration: Skin is not pale.  Neurological:     General: No focal deficit present.     Mental Status: She is alert and oriented to person, place, and time.  Psychiatric:        Mood and Affect: Mood normal.        Behavior: Behavior normal.     ED Results / Procedures / Treatments   Labs (all labs ordered are listed, but only abnormal results are displayed) Labs Reviewed  COMPREHENSIVE METABOLIC PANEL - Abnormal; Notable for the following components:      Result Value   Potassium 3.1 (*)    Calcium 8.8 (*)    All other components within normal limits  URINALYSIS, ROUTINE W REFLEX MICROSCOPIC - Abnormal; Notable for the following components:   APPearance HAZY (*)    Ketones, ur 80 (*)    Nitrite POSITIVE (*)    Leukocytes,Ua TRACE (*)    Bacteria, UA MANY (*)    All other components within normal limits  URINE CULTURE  LIPASE, BLOOD  CBC  I-STAT BETA HCG BLOOD, ED  ([facility], [facility], [facility] ONLY)    EKG None  Radiology DG Chest Port 1 View  Result Date: 03/24/2020 CLINICAL DATA:  Right rib and right upper quadrant pain. EXAM: PORTABLE CHEST 1 VIEW COMPARISON:  None. FINDINGS: The heart size and mediastinal contours are within normal limits. Both lungs are clear. No evidence of pneumothorax or pleural effusion. No definite abnormality of ribs seen on this portable single-view exam. IMPRESSION: Negative. No active cardiopulmonary disease.  Electronically Signed   By: Danae Orleans M.D.   On: 03/24/2020 12:49   US Abdomen Limited RUQ (LIVER/GB)  Result Date: 03/24/2020 CLINICAL DATA:  Right upper quadrant abdominal pain beginning this morning. EXAM: ULTRASOUND ABDOMEN LIMITED RIGHT UPPER QUADRANT COMPARISON:  None. FINDINGS: Gallbladder: No gallstones or wall thickening visualized. No sonographic Murphy sign noted by sonographer. Common bile duct: Diameter: 3 mm, within normal limits. Liver: No focal lesion identified. Within normal limits in parenchymal echogenicity. Portal vein is patent on color Doppler imaging with normal direction of blood flow towards the liver. Other: None. IMPRESSION: Negative. No hepatobiliary abnormality identified. Electronically Signed   By: Danae Orleans M.D.   On: 03/24/2020 12:54    Procedures Procedures   Medications Ordered in ED Medications  morphine 4 MG/ML injection 4 mg (4 mg Intravenous Given 03/24/20 1234)    ED Course  I have reviewed the triage vital signs and the nursing notes.  Pertinent labs & imaging results that were available during my care of the patient were reviewed by me and considered in my medical decision making (see chart for details).    MDM Rules/Calculators/A&P                         Tanya Morse is a 35 y.o. female with prior medical history of ADHD that presents the emerge department today for abdominal pain in the right upper quadrant for the past couple of hours.  Work-up today with unremarkable CBC,  no leukocytosis.  Negative pregnancy test.  CMP with potassium 3.1, did give her prescription for this.  UA does show signs of UTI, shared decision making with patient if she wants to treat this since she is not having any dysuria however she is having abdominal pain.  Patient states that she does want to treat this.  Antibiotics provided.  Right upper quadrant ultrasound negative for Chole.  Chest x-ray negative, interpreted by me.    Upon reevaluation, patient's pain is gone, no surgical abdomen, patient to be discharged at this time.  She will follow up with her PCP.  Doubt need for further emergent work up at this time. I explained the diagnosis and have given explicit precautions to return to the ER including for any other new or worsening symptoms. The patient understands and accepts the medical plan as it's been dictated and I have answered their questions. Discharge instructions concerning home care and prescriptions have been given. The patient is STABLE and is discharged to home in good condition.   Final Clinical Impression(s) / ED Diagnoses Final diagnoses:  RUQ pain  Acute cystitis without hematuria    Rx / DC Orders ED Discharge Orders         Ordered    potassium chloride (KLOR-CON) 10 MEQ tablet  Daily        03/24/20 1446    cephALEXin (KEFLEX) 500 MG capsule  2 times daily        03/24/20 1446           Farrel Gordon, PA-C 03/24/20 1510    Cathren Laine, MD 03/28/20 [phone]

## 2020-03-24 NOTE — ED Triage Notes (Signed)
Patient reports to the ER for abdominal pain. VSS per EMS. Patient denies pain with urination. LMP x2 weeks ago. Patient reports RUQ pain.

## 2020-03-26 LAB — URINE CULTURE: Culture: 80000 — AB

## 2020-03-27 ENCOUNTER — Telehealth: Payer: Self-pay | Admitting: Emergency Medicine

## 2020-03-27 NOTE — Telephone Encounter (Signed)
Post ED Visit - Positive Culture Follow-up  Culture report reviewed by antimicrobial stewardship pharmacist: [facility] []  Nathan Batchelder, Pharm.D. []  , Pharm.D., BCPS AQ-ID []  , Pharm.D., BCPS []  Celedonio Miyamoto, .D., BCPS []  [place], .D., BCPS, AAHIVP []  Georgina Pillion, Pharm.D., BCPS, AAHIVP []  [address], PharmD, BCPS []  , PharmD, BCPS []  [place], PharmD, BCPS []  [place], PharmD []  , PharmD, BCPS []  Estella Husk, PharmD  Pharmacy Team []  Lysle Pearl, PharmD []  , PharmD []  Phillips Climes, PharmD []  , Rph []  Agapito Games) , PharmD []  Verlan Friends, PharmD []  , PharmD []  Mervyn Gay, PharmD []  , PharmD []  Vinnie Level, PharmD []  [facility], PharmD []  , PharmD []  Len Childs, PharmD   Positive urine culture Treated with cephalexin, organism sensitive to the same and no further patient follow-up is required at this time.  03/27/2020, 9:52 AM

## 2020-08-19 ENCOUNTER — Other Ambulatory Visit: Payer: Self-pay

## 2020-08-19 ENCOUNTER — Ambulatory Visit
Admission: EM | Admit: 2020-08-19 | Discharge: 2020-08-19 | Disposition: A | Discharge status: Home / Self Care | Payer: Self-pay | Attending: Internal Medicine | Admitting: Internal Medicine

## 2020-08-19 ENCOUNTER — Encounter: Payer: Self-pay | Admitting: Emergency Medicine

## 2020-08-19 DIAGNOSIS — U071 COVID-19: Secondary | ICD-10-CM

## 2020-08-19 NOTE — ED Provider Notes (Signed)
[facility]    CSN: [id number] Arrival date & time: 08/19/20  1037      History   Chief Complaint Chief Complaint  Patient presents with   Headache   Shortness of Breath   Chills   Fatigue    HPI Tanya Morse is a 35 y.o. female to the urgent care for evaluation regarding recent COVID infection.  Patient had some headaches, chills, shortness of breath, fatigue and loss of appetite.  She tested positive for COVID-19 virus 4 days ago.  Patient states her symptoms are better and she is looking forward to going back to work tomorrow.  She inquired about antiviral agents.  She has no chronic medical problems.Marland Kitchen   HPI  Past Medical History:  Diagnosis Date   ADHD (attention deficit hyperactivity disorder)     There are no problems to display for this patient.   Past Surgical History:  Procedure Laterality Date   BREAST SURGERY      OB History   No obstetric history on file.      Home Medications    Prior to Admission medications   Not on File    Family History History reviewed. No pertinent family history.  Social History Social History   Tobacco Use   Smoking status: Never   Smokeless tobacco: Never  Vaping Use   Vaping Use: Never used  Substance Use Topics   Alcohol use: Yes    Comment: occasional    Drug use: No     Allergies   Patient has no known allergies.   Review of Systems Review of Systems  Respiratory: Negative.    Cardiovascular: Negative.   Musculoskeletal: Negative.   Neurological: Negative.     Physical Exam Triage Vital Signs ED Triage Vitals  Enc Vitals Group     BP 08/19/20 1134 112/80     Pulse Rate 08/19/20 1134 68     Resp 08/19/20 1134 18     Temp 08/19/20 1134 98 F (36.7 C)     Temp Source 08/19/20 1134 Oral     SpO2 08/19/20 1134 98 %     Weight --      Height --      Head Circumference --      Peak Flow --      Pain Score 08/19/20 1132 0     Pain Loc --      Pain Edu? --      Excl. in GC?  --    No data found.  Updated Vital Signs BP 112/80   Pulse 68   Temp 98 F (36.7 C) (Oral)   Resp 18   SpO2 98%   Visual Acuity Right Eye Distance:   Left Eye Distance:   Bilateral Distance:    Right Eye Near:   Left Eye Near:    Bilateral Near:     Physical Exam Vitals and nursing note reviewed.  Constitutional:      General: She is not in acute distress.    Appearance: She is not ill-appearing.  Cardiovascular:     Rate and Rhythm: Normal rate and regular rhythm.     Heart sounds: Normal heart sounds.  Pulmonary:     Effort: Pulmonary effort is normal.     Breath sounds: Normal breath sounds.  Abdominal:     General: Bowel sounds are normal.     Palpations: Abdomen is soft.  Neurological:     Mental Status: She is alert.  UC Treatments / Results  Labs (all labs ordered are listed, but only abnormal results are displayed) Labs Reviewed - No data to display  EKG   Radiology No results found.  Procedures Procedures (including critical care time)  Medications Ordered in UC Medications - No data to display  Initial Impression / Assessment and Plan / UC Course  I have reviewed the triage vital signs and the nursing notes.  Pertinent labs & imaging results that were available during my care of the patient were reviewed by me and considered in my medical decision making (see chart for details).     1.  COVID-19 infection: Patient is improving clinically. We discussed the indications for antiviral medications. Patient is not a candidate for antiviral medications given that her symptoms is day 4 of 5 today and she has no chronic medical problems or immunosuppression. Patient is advised to wear N95 when she goes back to work tomorrow.  She verbalized understanding. Final Clinical Impressions(s) / UC Diagnoses   Final diagnoses:  COVID-19 virus infection     Discharge Instructions      Continue taking Tylenol or Motrin as needed for body aches  and or fever Increase oral fluid intake Follow the precautions at your workplace and wear your mask for the next 5 days was at work or around people. There is no indication for antiviral medications at this time.   ED Prescriptions   None    PDMP not reviewed this encounter.   Merrilee Jansky, MD 08/19/20 [phone]

## 2020-08-19 NOTE — Discharge Instructions (Addendum)
Continue taking Tylenol or Motrin as needed for body aches and or fever Increase oral fluid intake Follow the precautions at your workplace and wear your mask for the next 5 days was at work or around people. There is no indication for antiviral medications at this time.

## 2020-08-19 NOTE — ED Triage Notes (Signed)
Pt is present today with HA, SOB, chills, fatigue, loss of appetite. Pt states that she tested positive for covid Friday

## 2021-04-16 ENCOUNTER — Other Ambulatory Visit: Payer: Self-pay

## 2021-04-16 ENCOUNTER — Emergency Department (HOSPITAL_COMMUNITY): Payer: BC Managed Care – PPO

## 2021-04-16 ENCOUNTER — Emergency Department (HOSPITAL_COMMUNITY)
Admission: EM | Admit: 2021-04-16 | Discharge: 2021-04-16 | Disposition: A | Discharge status: Home / Self Care | Payer: BC Managed Care – PPO | Attending: Emergency Medicine | Admitting: Emergency Medicine

## 2021-04-16 ENCOUNTER — Encounter (HOSPITAL_COMMUNITY): Payer: Self-pay | Admitting: Emergency Medicine

## 2021-04-16 DIAGNOSIS — R0602 Shortness of breath: Secondary | ICD-10-CM | POA: Insufficient documentation

## 2021-04-16 DIAGNOSIS — R051 Acute cough: Secondary | ICD-10-CM | POA: Insufficient documentation

## 2021-04-16 DIAGNOSIS — R059 Cough, unspecified: Secondary | ICD-10-CM | POA: Diagnosis present

## 2021-04-16 MED ORDER — ALBUTEROL SULFATE HFA 108 (90 BASE) MCG/ACT IN AERS
2.0000 | INHALATION_SPRAY | Freq: Once | RESPIRATORY_TRACT | Status: AC
Start: 2021-04-16 — End: 2021-04-16
  Administered 2021-04-16: 2 via RESPIRATORY_TRACT
  Filled 2021-04-16: qty 6.7

## 2021-04-16 MED ORDER — ALBUTEROL SULFATE (2.5 MG/3ML) 0.083% IN NEBU
5.0000 mg | INHALATION_SOLUTION | Freq: Once | RESPIRATORY_TRACT | Status: AC
  Administered 2021-04-16: 5 mg via RESPIRATORY_TRACT
  Filled 2021-04-16: qty 6
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
## 2021-04-16 NOTE — ED Notes (Signed)
Inhaler education preformed.  Pt states understanding and able to demonstrate teaching correctly.  ?

## 2021-04-16 NOTE — ED Triage Notes (Signed)
Pt reports cleaning and mixing bleach with toilet bowl cleaner and inhaling it. Pt reports now feeling lightheaded and weak.   ?

## 2021-04-16 NOTE — ED Notes (Signed)
Discharge instructions were discussed with pt. Inhaler was provided and discussed by previous Charity fundraiser. Pt verbalized understanding with no questions at this time. Pt to go home with family/friend at bedside.  ?

## 2021-04-16 NOTE — Discharge Instructions (Signed)
Use the inhaler 2 puffs every 3-4 hours as needed for cough or trouble breathing.  Follow-up with your doctor as needed for problems ?

## 2021-11-30 IMAGING — DX DG CHEST 1V PORT
1 series · 1 of 1 positions shown · non-contrast
Comparison: None.

CLINICAL DATA: Right rib and right upper quadrant pain.

EXAM:
PORTABLE CHEST 1 VIEW

[chest ap]
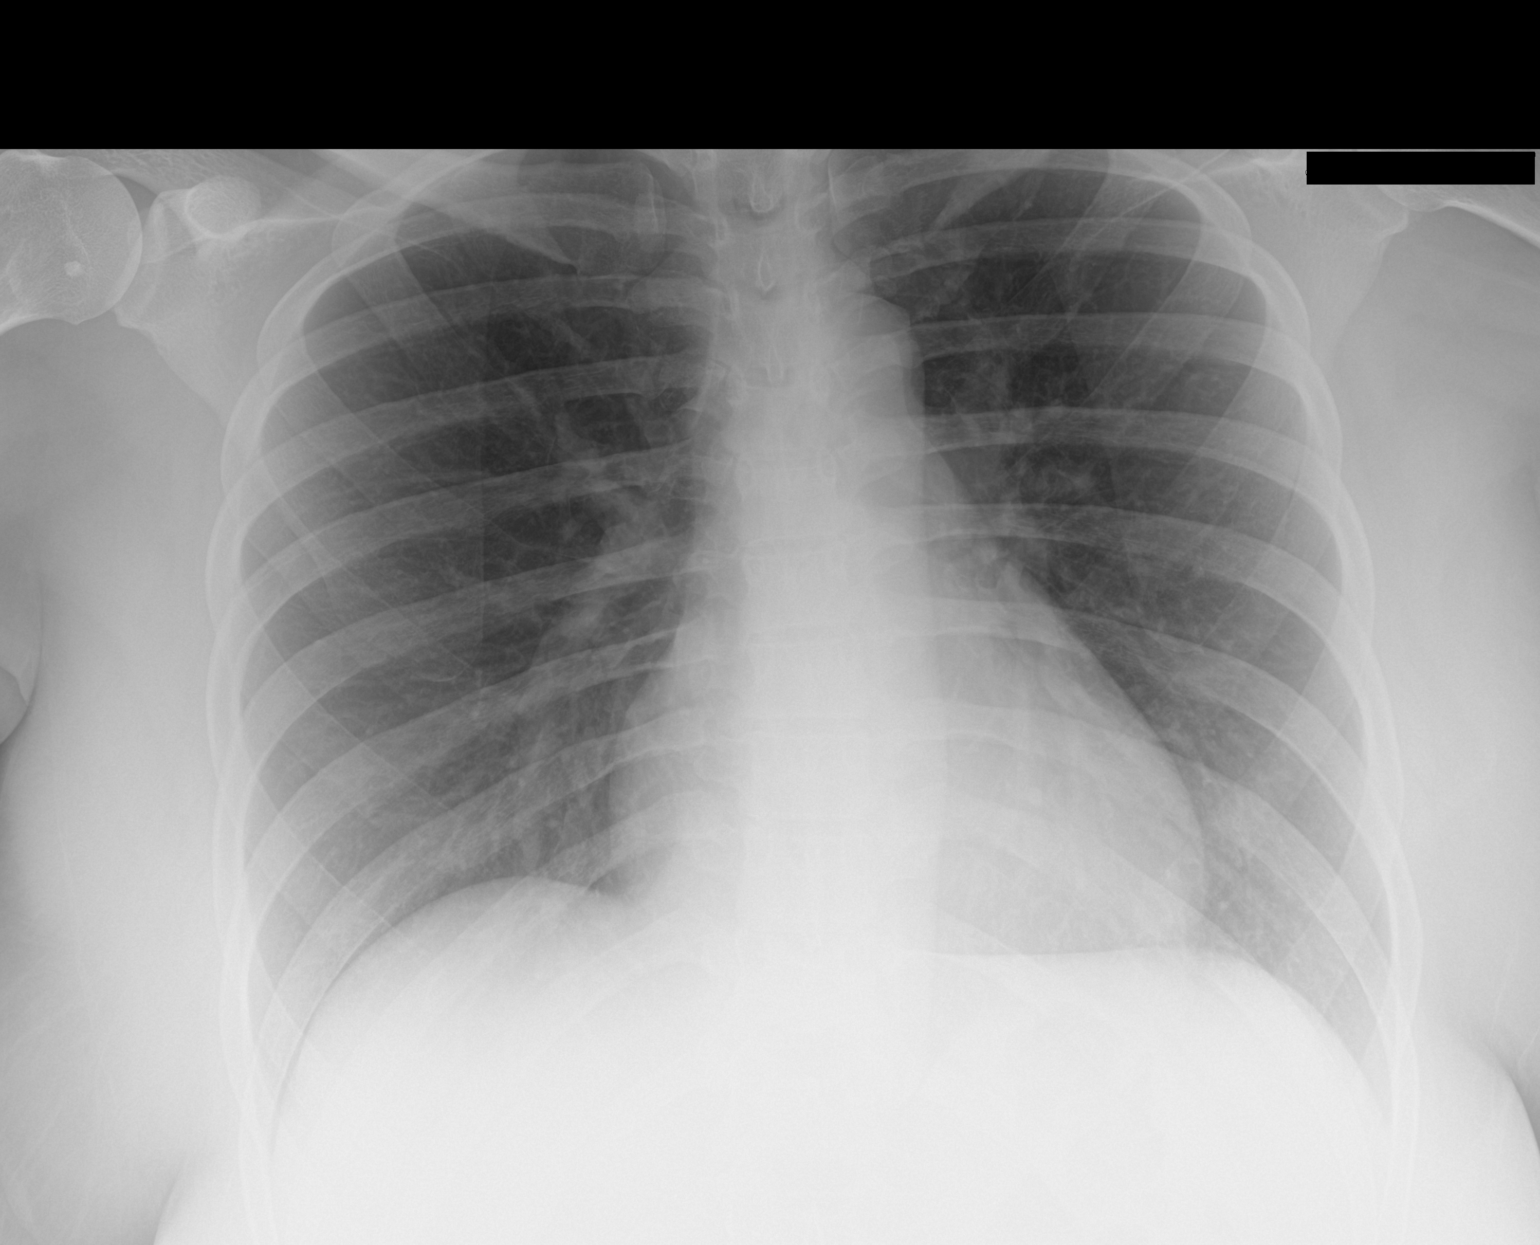

[1 of 1 positions shown; findings below may reference images not displayed]

FINDINGS: The heart size and mediastinal contours are within normal limits.
Both lungs are clear. No evidence of pneumothorax or pleural
effusion. No definite abnormality of ribs seen on this portable
single-view exam.
IMPRESSION: Negative. No active cardiopulmonary disease.

## 2021-12-19 ENCOUNTER — Encounter (HOSPITAL_COMMUNITY): Payer: Self-pay

## 2021-12-19 ENCOUNTER — Emergency Department (HOSPITAL_COMMUNITY)
Admission: EM | Admit: 2021-12-19 | Discharge: 2021-12-20 | Disposition: A | Discharge status: Home / Self Care | Payer: No Typology Code available for payment source | Attending: Emergency Medicine | Admitting: Emergency Medicine

## 2021-12-19 ENCOUNTER — Other Ambulatory Visit: Payer: Self-pay

## 2021-12-19 DIAGNOSIS — S060X0A Concussion without loss of consciousness, initial encounter: Secondary | ICD-10-CM | POA: Diagnosis not present

## 2021-12-19 DIAGNOSIS — S46912A Strain of unspecified muscle, fascia and tendon at shoulder and upper arm level, left arm, initial encounter: Secondary | ICD-10-CM | POA: Insufficient documentation

## 2021-12-19 DIAGNOSIS — Y92410 Unspecified street and highway as the place of occurrence of the external cause: Secondary | ICD-10-CM | POA: Insufficient documentation

## 2021-12-19 DIAGNOSIS — S0990XA Unspecified injury of head, initial encounter: Secondary | ICD-10-CM | POA: Diagnosis present
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
## 2023-09-28 ENCOUNTER — Ambulatory Visit: Payer: Self-pay

## 2023-09-28 NOTE — Telephone Encounter (Signed)
 FYI Only or Action Required?: FYI only for provider.  Patient was last seen in primary care on N/A.  Called Nurse Triage reporting Rash.  Symptoms began several days ago.  Interventions attempted: OTC medications: Calamine lotion.  Symptoms are: stable.  Triage Disposition: See PCP When Office is Open (Within 3 Days)  Patient/caregiver understands and will follow disposition?: Yes     Copied from CRM #[id number]. Topic: Clinical - Red Word Triage >> Sep 28, 2023  1:51 PM Charlet HERO wrote: Red Word that prompted transfer to Nurse Triage: Patient is calling to schedule appt with [name] she is stating that she has a rash over her entire face and it is red. Reason for Disposition  Localized rash present > 7 days  Answer Assessment - Initial Assessment Questions Eaten hot mustard on Friday, never eaten that before. Dyed hair on Friday as well, a hair dye she has used before. Just used calamine lotion, cannot tell if its helping. Feels like she's at a stand still. Patient no established, New Pt Appointment scheduled with [name] 01/19/24. Advised patient to UC for rash symptoms   1. APPEARANCE of RASH: What does the rash look like? (e.g., blisters, dry flaky skin, red spots, redness, sores)     Red spots   2. LOCATION: Where is the rash located?      All over face   3. SIZE: How big are the spots? (e.g., inches, cm; or compare to size of pinhead, tip of pen, eraser, pea)      Size of tip of pen  4. ONSET: When did the rash start?      Saturday  5. ITCHING: Does the rash itch? If Yes, ask: How bad is the itch?  (Scale 0-10; or none, mild, moderate, severe)     None  6. PAIN: Does the rash hurt? If Yes, ask: How bad is the pain?  (Scale 0-10; or none, mild, moderate, severe)     None  7. OTHER SYMPTOMS: Do you have any other symptoms? (e.g., fever)     No  Protocols used: Rash or Redness - Localized-A-AH

## 2023-12-10 ENCOUNTER — Emergency Department (HOSPITAL_COMMUNITY)
Admission: EM | Admit: 2023-12-10 | Discharge: 2023-12-10 | Disposition: A | Discharge status: Home / Self Care | Payer: Self-pay | Attending: Emergency Medicine | Admitting: Emergency Medicine

## 2023-12-10 DIAGNOSIS — M5410 Radiculopathy, site unspecified: Secondary | ICD-10-CM | POA: Insufficient documentation

## 2023-12-10 MED ORDER — LIDOCAINE 5 % EX PTCH
1.0000 | MEDICATED_PATCH | CUTANEOUS | Status: DC
Start: 2023-12-10 — End: 2023-12-10
  Administered 2023-12-10: 1 via TRANSDERMAL
  Filled 2023-12-10: qty 1

## 2023-12-10 MED ORDER — PREDNISONE 10 MG (21) PO TBPK: ORAL_TABLET | Freq: Every day | ORAL | 0 refills | Status: DC

## 2023-12-10 MED ORDER — KETOROLAC TROMETHAMINE 30 MG/ML IJ SOLN
30.0000 mg | Freq: Once | INTRAMUSCULAR | Status: AC
  Administered 2023-12-10: 30 mg via INTRAMUSCULAR
  Filled 2023-12-10: qty 1

## 2023-12-10 MED ORDER — METHYLPREDNISOLONE SODIUM SUCC 125 MG IJ SOLR
125.0000 mg | Freq: Once | INTRAMUSCULAR | Status: AC
  Administered 2023-12-10: 125 mg via INTRAMUSCULAR
  Filled 2023-12-10: qty 2

## 2023-12-10 NOTE — ED Triage Notes (Signed)
 C/o left hip pain for 1.5 weeks radiating down her leg. Says her foot feels numb. No known injury.

## 2023-12-10 NOTE — ED Provider Notes (Signed)
 [place] EMERGENCY DEPARTMENT AT [facility] Provider Note   CSN: [id number] Arrival date & time: 12/10/23  1426     Patient presents with: Leg Pain   Tashica Provencio is a 38 y.o. female presents today for left hip/buttock pain x 1.5 week.  Patient reports pain radiation to her foot and decreased strength on the left leg secondary to pain.  Patient denies trauma, fever, loss of bowel or bladder control, numbness, saddle anesthesia, or any other complaints at this time.  Patient denies previous IV drug use.    Leg Pain      Prior to Admission medications   Medication Sig Start Date End Date Taking? Authorizing Provider  predniSONE (STERAPRED UNI-PAK 21 TAB) 10 MG (21) TBPK tablet Take by mouth daily. Take 6 tabs by mouth daily  for 2 days, then 5 tabs for 2 days, then 4 tabs for 2 days, then 3 tabs for 2 days, 2 tabs for 2 days, then 1 tab by mouth daily for 2 days 12/10/23  Yes Francis Ileana SAILOR, PA-C    Allergies: Patient has no known allergies.    Review of Systems  Musculoskeletal:  Positive for arthralgias and myalgias.    Updated Vital Signs BP (!) 152/119 (BP Location: Left Arm)   Pulse 75   Temp (!) 97.5 F (36.4 C) (Oral)   Resp 18   SpO2 100%   Physical Exam Vitals and nursing note reviewed.  Constitutional:      General: She is not in acute distress.    Appearance: She is well-developed. She is not toxic-appearing.     Comments: Uncomfortable appearing  HENT:     Head: Normocephalic and atraumatic.     Right Ear: External ear normal.     Left Ear: External ear normal.  Eyes:     Conjunctiva/sclera: Conjunctivae normal.  Cardiovascular:     Rate and Rhythm: Normal rate and regular rhythm.     Pulses: Normal pulses.     Heart sounds: Normal heart sounds. No murmur heard. Pulmonary:     Effort: Pulmonary effort is normal. No respiratory distress.  Abdominal:     Palpations: Abdomen is soft.     Tenderness: There is no abdominal tenderness.   Musculoskeletal:        General: No swelling or deformity.     Cervical back: Neck supple.     Right lower leg: No edema.     Left lower leg: No edema.     Comments: Positive straight leg test on left.  Reproducible pain with radiation when palpating the left buttock.  Patient denies bony tenderness to palpation of the spine.  No deformities, step-offs, ecchymosis, or erythema noted on exam.  Skin:    General: Skin is warm and dry.     Capillary Refill: Capillary refill takes less than 2 seconds.  Neurological:     General: No focal deficit present.     Mental Status: She is alert and oriented to person, place, and time.     Sensory: No sensory deficit.     Motor: No weakness.  Psychiatric:        Mood and Affect: Mood normal.     (all labs ordered are listed, but only abnormal results are displayed) Labs Reviewed - No data to display  EKG: None  Radiology: No results found.   Procedures   Medications Ordered in the ED  ketorolac  (TORADOL ) 30 MG/ML injection 30 mg (has no administration in time range)  lidocaine (LIDODERM) 5 % 1 patch (has no administration in time range)  methylPREDNISolone sodium succinate (SOLU-MEDROL) 125 mg/2 mL injection 125 mg (has no administration in time range)                                    Medical Decision Making  This patient presents to the ED for concern of L hip/leg pain differential diagnosis includes fracture, dislocation, sciatica, piriformis syndrome, musculoskeletal pain, cauda equina syndrome, spinal cord injury, epidural abscess   Medicines ordered and prescription drug management:  I ordered medication including Toradol , Solu-Medrol, Lidoderm patch    I have reviewed the patients home medicines and have made adjustments as needed   Problem List / ED Course:  Considered for admission or further workup however patient's vital signs and physical exam are reassuring.  Patient has no red flag signs or symptoms concerning  for epidural abscess, spinal cord injury, fracture, dislocation, or cauda equina syndrome.  Patient's symptoms most consistent with sciatica or piriformis syndrome.  Patient given steroid taper and advised to alternate Tylenol /Motrin  as needed for pain.  Patient to follow-up with [facility] if symptoms persist for further evaluation and workup.  Patient given return precautions.  I feel patient is safe for discharge at this time.    Final diagnoses:  Radicular leg pain    ED Discharge Orders          Ordered    predniSONE (STERAPRED UNI-PAK 21 TAB) 10 MG (21) TBPK tablet  Daily        12/10/23 1542               Francis Ileana SAILOR, PA-C 12/10/23 1543    Neysa Caron PARAS, DO 12/10/23 2247

## 2023-12-10 NOTE — Discharge Instructions (Addendum)
 Today you were seen for radicular leg pain.  Please pick up your medication and take as prescribed.  You may alternate taking Tylenol  and Motrin  every 4 hours for pain.  Please follow-up with [facility] if your pain persist for further evaluation workup.  Thank you for letting us  treat you today. After performing a physical exam, I feel you are safe to go home. Please follow up with your PCP in the next several days and provide them with your records from this visit. Return to the Emergency Room if pain becomes severe or symptoms worsen.

## 2023-12-28 ENCOUNTER — Other Ambulatory Visit (HOSPITAL_COMMUNITY): Payer: Self-pay | Admitting: Neurological Surgery

## 2023-12-28 DIAGNOSIS — M5416 Radiculopathy, lumbar region: Secondary | ICD-10-CM

## 2023-12-31 ENCOUNTER — Ambulatory Visit (HOSPITAL_COMMUNITY)
Admission: RE | Admit: 2023-12-31 | Discharge: 2023-12-31 | Payer: Self-pay | Attending: Neurological Surgery | Admitting: Neurological Surgery

## 2023-12-31 DIAGNOSIS — M5416 Radiculopathy, lumbar region: Secondary | ICD-10-CM

## 2024-01-01 ENCOUNTER — Ambulatory Visit (HOSPITAL_COMMUNITY)
Admission: RE | Admit: 2024-01-01 | Discharge: 2024-01-01 | Payer: Self-pay | Attending: Neurological Surgery | Admitting: Neurological Surgery

## 2024-01-14 ENCOUNTER — Ambulatory Visit (INDEPENDENT_AMBULATORY_CARE_PROVIDER_SITE_OTHER): Payer: Self-pay | Admitting: Nurse Practitioner

## 2024-01-14 ENCOUNTER — Encounter: Payer: Self-pay | Admitting: Nurse Practitioner

## 2024-01-14 VITALS — BP 100/60 | HR 81 | Temp 98.7°F | Ht 69.0 in | Wt 222.0 lb

## 2024-01-14 DIAGNOSIS — Z6832 Body mass index (BMI) 32.0-32.9, adult: Secondary | ICD-10-CM

## 2024-01-14 DIAGNOSIS — Z139 Encounter for screening, unspecified: Secondary | ICD-10-CM

## 2024-01-14 DIAGNOSIS — Z7689 Persons encountering health services in other specified circumstances: Secondary | ICD-10-CM

## 2024-01-14 DIAGNOSIS — E6609 Other obesity due to excess calories: Secondary | ICD-10-CM

## 2024-01-14 DIAGNOSIS — Z13228 Encounter for screening for other metabolic disorders: Secondary | ICD-10-CM

## 2024-01-14 DIAGNOSIS — E66811 Obesity, class 1: Secondary | ICD-10-CM

## 2024-01-14 DIAGNOSIS — R21 Rash and other nonspecific skin eruption: Secondary | ICD-10-CM

## 2024-01-14 DIAGNOSIS — Z113 Encounter for screening for infections with a predominantly sexual mode of transmission: Secondary | ICD-10-CM

## 2024-01-14 DIAGNOSIS — Z114 Encounter for screening for human immunodeficiency virus [HIV]: Secondary | ICD-10-CM

## 2024-01-14 DIAGNOSIS — M5126 Other intervertebral disc displacement, lumbar region: Secondary | ICD-10-CM

## 2024-01-14 DIAGNOSIS — F32A Depression, unspecified: Secondary | ICD-10-CM

## 2024-01-14 DIAGNOSIS — F419 Anxiety disorder, unspecified: Secondary | ICD-10-CM

## 2024-01-14 DIAGNOSIS — Z2821 Immunization not carried out because of patient refusal: Secondary | ICD-10-CM

## 2024-01-14 DIAGNOSIS — N979 Female infertility, unspecified: Secondary | ICD-10-CM
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
## 2024-01-15 LAB — CBC
Hematocrit: 40.9 % (ref 34.0–46.6)
Hemoglobin: 13.8 g/dL (ref 11.1–15.9)
MCH: 33.3 pg — ABNORMAL HIGH (ref 26.6–33.0)
MCHC: 33.7 g/dL (ref 31.5–35.7)
MCV: 99 fL — ABNORMAL HIGH (ref 79–97)
Platelets: 246 x10E3/uL (ref 150–450)
RBC: 4.14 x10E6/uL (ref 3.77–5.28)
RDW: 13.1 % (ref 11.7–15.4)
WBC: 5 x10E3/uL (ref 3.4–10.8)

## 2024-01-15 LAB — HEPATITIS B SURFACE ANTIBODY,QUALITATIVE: Hep B Surface Ab, Qual: REACTIVE

## 2024-01-15 LAB — CMP14+EGFR
ALT: 9 IU/L (ref 0–32)
AST: 17 IU/L (ref 0–40)
Albumin: 4.1 g/dL (ref 3.9–4.9)
Alkaline Phosphatase: 48 IU/L (ref 41–116)
BUN/Creatinine Ratio: 7 — ABNORMAL LOW (ref 9–23)
BUN: 5 mg/dL — ABNORMAL LOW (ref 6–20)
Bilirubin Total: 0.3 mg/dL (ref 0.0–1.2)
CO2: 21 mmol/L (ref 20–29)
Calcium: 8.9 mg/dL (ref 8.7–10.2)
Chloride: 106 mmol/L (ref 96–106)
Creatinine, Ser: 0.69 mg/dL (ref 0.57–1.00)
Globulin, Total: 2.5 g/dL (ref 1.5–4.5)
Glucose: 86 mg/dL (ref 70–99)
Potassium: 3.7 mmol/L (ref 3.5–5.2)
Sodium: 142 mmol/L (ref 134–144)
Total Protein: 6.6 g/dL (ref 6.0–8.5)
eGFR: 115 mL/min/1.73

## 2024-01-15 LAB — HEMOGLOBIN A1C
Est. average glucose Bld gHb Est-mCnc: 103 mg/dL
Hgb A1c MFr Bld: 5.2 % (ref 4.8–5.6)

## 2024-01-15 LAB — TSH: TSH: 0.886 u[IU]/mL (ref 0.450–4.500)

## 2024-01-15 LAB — HIV ANTIBODY (ROUTINE TESTING W REFLEX): HIV Screen 4th Generation wRfx: NONREACTIVE

## 2024-01-15 LAB — SYPHILIS: RPR W/REFLEX TO RPR TITER AND TREPONEMAL ANTIBODIES, TRADITIONAL SCREENING AND DIAGNOSIS ALGORITHM: RPR Ser Ql: NONREACTIVE

## 2024-01-18 ENCOUNTER — Telehealth: Payer: Self-pay

## 2024-01-18 ENCOUNTER — Ambulatory Visit: Payer: Self-pay | Admitting: Nurse Practitioner

## 2024-01-18 LAB — NUSWAB VAGINITIS PLUS (VG+)
Atopobium vaginae: HIGH {score} — AB
BVAB 2: HIGH {score} — AB
Candida albicans, NAA: NEGATIVE
Candida glabrata, NAA: NEGATIVE
Chlamydia trachomatis, NAA: NEGATIVE
Megasphaera 1: HIGH {score} — AB
Neisseria gonorrhoeae, NAA: NEGATIVE
Trich vag by NAA: POSITIVE — AB

## 2024-01-18 MED ORDER — METRONIDAZOLE 500 MG PO TABS: 500.0000 mg | ORAL_TABLET | Freq: Two times a day (BID) | ORAL | 0 refills | Status: DC

## 2024-01-18 NOTE — Telephone Encounter (Signed)
 Spoke with patient and gave her lab results via fpl group from provider [name].

## 2024-01-19 ENCOUNTER — Ambulatory Visit: Payer: Self-pay | Admitting: Nurse Practitioner

## 2024-01-23 DIAGNOSIS — R21 Rash and other nonspecific skin eruption: Secondary | ICD-10-CM | POA: Insufficient documentation

## 2024-01-23 DIAGNOSIS — M5126 Other intervertebral disc displacement, lumbar region: Secondary | ICD-10-CM | POA: Insufficient documentation

## 2024-01-23 NOTE — Assessment & Plan Note (Addendum)
 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 Gabapentin effective for nerve pain. Surgical intervention recommended due to risk of severe complications. - Continue gabapentin for pain management. - Schedule surgery with neurosurgeon. - Discuss post-operative recovery and lifting restrictions with neurosurgeon.

## 2024-01-23 NOTE — Assessment & Plan Note (Signed)
 Rash since late fall. No facial cleanser use. - Recommend using a facial cleanser such as Neutrogena. - Evaluate rash for potential need for antibiotic cleanser.

## 2024-02-03 ENCOUNTER — Other Ambulatory Visit: Payer: Self-pay | Admitting: Neurological Surgery

## 2024-02-25 ENCOUNTER — Emergency Department (HOSPITAL_COMMUNITY)

## 2024-02-25 ENCOUNTER — Emergency Department (HOSPITAL_COMMUNITY)
Admission: EM | Admit: 2024-02-25 | Discharge: 2024-02-25 | Disposition: A | Discharge status: Home / Self Care | Attending: Emergency Medicine | Admitting: Emergency Medicine

## 2024-02-25 DIAGNOSIS — G8929 Other chronic pain: Secondary | ICD-10-CM | POA: Diagnosis not present

## 2024-02-25 DIAGNOSIS — Y92410 Unspecified street and highway as the place of occurrence of the external cause: Secondary | ICD-10-CM | POA: Diagnosis not present

## 2024-02-25 DIAGNOSIS — R519 Headache, unspecified: Secondary | ICD-10-CM | POA: Diagnosis present

## 2024-02-25 DIAGNOSIS — G44319 Acute post-traumatic headache, not intractable: Secondary | ICD-10-CM | POA: Insufficient documentation

## 2024-02-25 DIAGNOSIS — M545 Low back pain, unspecified: Secondary | ICD-10-CM | POA: Diagnosis not present

## 2024-02-25 MED ORDER — METHOCARBAMOL 500 MG PO TABS: 500.0000 mg | ORAL_TABLET | Freq: Two times a day (BID) | ORAL | 0 refills | Status: AC

## 2024-02-25 MED ORDER — KETOROLAC TROMETHAMINE 15 MG/ML IJ SOLN
15.0000 mg | Freq: Once | INTRAMUSCULAR | Status: AC
  Administered 2024-02-25: 15 mg via INTRAMUSCULAR
  Filled 2024-02-25: qty 1

## 2024-02-25 MED ORDER — LIDOCAINE 5 % EX PTCH: 1.0000 | MEDICATED_PATCH | CUTANEOUS | 0 refills | Status: AC

## 2024-02-25 MED ORDER — NAPROXEN 500 MG PO TABS: 500.0000 mg | ORAL_TABLET | Freq: Two times a day (BID) | ORAL | 0 refills | Status: AC

## 2024-02-25 MED ORDER — ONDANSETRON HCL 4 MG PO TABS: 4.0000 mg | ORAL_TABLET | Freq: Four times a day (QID) | ORAL | 0 refills | Status: AC
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
## 2024-03-03 NOTE — Progress Notes (Signed)
 Surgical Instructions   Your procedure is scheduled on Wednesday, February 11th. Report to [facility] Main Entrance A at 08:30 A.M., then check in with the Admitting office. Any questions or running late day of surgery: call [phone]  Questions prior to your surgery date: call [phone], Monday-Friday, 8am-4pm. If you experience any cold or flu symptoms such as cough, fever, chills, shortness of breath, etc. between now and your scheduled surgery, please notify us  at the above number.     Remember:  Do not eat after midnight the night before your surgery  You may drink clear liquids until 7:30 the morning of your surgery.   Clear liquids allowed are: Water, Non-Citrus Juices (without pulp), Carbonated Beverages, Clear Tea (no milk, honey, etc.), Black Coffee Only (NO MILK, CREAM OR POWDERED CREAMER of any kind), and Gatorade.    Take these medicines the morning of surgery with A SIP OF WATER  gabapentin (NEURONTIN)    May take these medicines IF NEEDED: methocarbamol  (ROBAXIN )  naphazoline-glycerin (CLEAR EYES REDNESS) eye drops  ondansetron  (ZOFRAN )    One week prior to surgery, STOP taking any Aspirin (unless otherwise instructed by your surgeon) Aleve , Ibuprofen , Motrin , Advil , Goody's, BC's, all herbal medications, fish oil, and non-prescription vitamins. This includes naproxen  (NAPROSYN )                      Do NOT Smoke (Tobacco/Vaping) for 24 hours prior to your procedure.  If you use a CPAP at night, you may bring your mask/headgear for your overnight stay.   You will be asked to remove any contacts, glasses, piercing's, hearing aid's, dentures/partials prior to surgery. Please bring cases for these items if needed.    Your surgeon will determine if you are to be admitted or discharged the same day.  Patients discharged the day of surgery will not be allowed to drive home, and someone needs to stay with them for 24 hours.  SURGICAL WAITING ROOM  VISITATION Patients may have no more than 2 support people in the waiting area - these visitors may rotate.   Pre-op nurse will coordinate an appropriate time for 2 ADULT support persons, who may not rotate, to accompany patient in pre-op.  Children under the age of 40 must have an adult with them who is not the patient and must remain in the main waiting area with an adult.  If the patient needs to stay at the hospital during part of their recovery, the visitor guidelines for inpatient rooms apply.  Please refer to the [facility] website for the visitor guidelines for any additional information.   If you received a COVID test during your pre-op visit  it is requested that you wear a mask when out in public, stay away from anyone that may not be feeling well and notify your surgeon if you develop symptoms. If you have been in contact with anyone that has tested positive in the last 10 days please notify you surgeon.      Pre-operative 4 CHG Bathing Instructions   You can play a key role in reducing the risk of infection after surgery. Your skin needs to be as free of germs as possible. You can reduce the number of germs on your skin by washing with CHG (chlorhexidine gluconate) soap before surgery. CHG is an antiseptic soap that kills germs and continues to kill germs even after washing.   DO NOT use if you have an allergy to chlorhexidine/CHG or antibacterial soaps. If  your skin becomes reddened or irritated, stop using the CHG and notify one of our RNs at [phone].   Please shower with the CHG soap starting 4 days before surgery using the following schedule:     Please keep in mind the following:  DO NOT shave, including legs and underarms, starting the day of your first shower.   You may shave your face at any point before/day of surgery.  Place clean sheets on your bed the day you start using CHG soap. Use a clean washcloth (not used since being washed) for each shower. DO NOT  sleep with pets once you start using the CHG.   CHG Shower Instructions:  Wash your face and private area with normal soap. If you choose to wash your hair, wash first with your normal shampoo.  After you use shampoo/soap, rinse your hair and body thoroughly to remove shampoo/soap residue.  Turn the water OFF and apply  bottle of CHG soap to a CLEAN washcloth.  Apply CHG soap ONLY FROM YOUR NECK DOWN TO YOUR TOES (washing for 3-5 minutes)  DO NOT use CHG soap on face, private areas, open wounds, or sores.  Pay special attention to the area where your surgery is being performed.  If you are having back surgery, having someone wash your back for you may be helpful. Wait 2 minutes after CHG soap is applied, then you may rinse off the CHG soap.  Pat dry with a clean towel  Put on clean clothes/pajamas   If you choose to wear lotion, please use ONLY the CHG-compatible lotions that are listed below.  Additional instructions for the day of surgery:  If you choose, you may shower the morning of surgery with an antibacterial soap.  DO NOT APPLY any lotions, deodorants, cologne, or perfumes.   Do not bring valuables to the hospital. [facility] is not responsible for any belongings/valuables. Do not wear nail polish, gel polish, artificial nails, or any other type of covering on natural nails (fingers and toes) Do not wear jewelry or makeup Put on clean/comfortable clothes.  Please brush your teeth.  Ask your nurse before applying any prescription medications to the skin.     CHG Compatible Lotions   Aveeno Moisturizing lotion  Cetaphil Moisturizing Cream  Cetaphil Moisturizing Lotion  Clairol Herbal Essence Moisturizing Lotion, Dry Skin  Clairol Herbal Essence Moisturizing Lotion, Extra Dry Skin  Clairol Herbal Essence Moisturizing Lotion, Normal Skin  Curel Age Defying Therapeutic Moisturizing Lotion with Alpha Hydroxy  Curel Extreme Care Body Lotion  Curel Soothing Hands Moisturizing  Hand Lotion  Curel Therapeutic Moisturizing Cream, Fragrance-Free  Curel Therapeutic Moisturizing Lotion, Fragrance-Free  Curel Therapeutic Moisturizing Lotion, Original Formula  Eucerin Daily Replenishing Lotion  Eucerin Dry Skin Therapy Plus Alpha Hydroxy Crme  Eucerin Dry Skin Therapy Plus Alpha Hydroxy Lotion  Eucerin Original Crme  Eucerin Original Lotion  Eucerin Plus Crme Eucerin Plus Lotion  Eucerin TriLipid Replenishing Lotion  Keri Anti-Bacterial Hand Lotion  Keri Deep Conditioning Original Lotion Dry Skin Formula Softly Scented  Keri Deep Conditioning Original Lotion, Fragrance Free Sensitive Skin Formula  Keri Lotion Fast Absorbing Fragrance Free Sensitive Skin Formula  Keri Lotion Fast Absorbing Softly Scented Dry Skin Formula  Keri Original Lotion  Keri Skin Renewal Lotion Keri Silky Smooth Lotion  Keri Silky Smooth Sensitive Skin Lotion  Nivea Body Creamy Conditioning Oil  Nivea Body Extra Enriched Teacher, Adult Education Moisturizing Lotion Nivea Crme  Nivea Skin  Firming Lotion  NutraDerm 30 Skin Lotion  NutraDerm Skin Lotion  NutraDerm Therapeutic Skin Cream  NutraDerm Therapeutic Skin Lotion  ProShield Protective Hand Cream  Provon moisturizing lotion  Please read over the following fact sheets that you were given.

## 2024-03-06 ENCOUNTER — Inpatient Hospital Stay (HOSPITAL_COMMUNITY): Admission: RE | Admit: 2024-03-06

## 2024-03-08 ENCOUNTER — Ambulatory Visit (HOSPITAL_COMMUNITY): Admission: RE | Admit: 2024-03-08 | Payer: Self-pay | Source: Home / Self Care | Admitting: Neurological Surgery

## 2024-03-08 ENCOUNTER — Encounter: Admission: RE | Payer: Self-pay

## 2024-03-20 ENCOUNTER — Encounter: Payer: Self-pay | Admitting: Nurse Practitioner
